# Patient Record
Sex: Female | Born: 1970 | Race: Black or African American | Hispanic: No | Marital: Married
Health system: Southern US, Community
[De-identification: ages and names within clinical notes are randomized; demographics above are authoritative.]

---

## 2003-06-14 ENCOUNTER — Ambulatory Visit (HOSPITAL_COMMUNITY): Admission: RE | Admit: 2003-06-14 | Discharge: 2003-06-14 | Payer: Self-pay | Admitting: *Deleted

## 2003-11-14 ENCOUNTER — Inpatient Hospital Stay (HOSPITAL_COMMUNITY): Admission: AD | Admit: 2003-11-14 | Discharge: 2003-11-17 | Payer: Self-pay | Admitting: *Deleted

## 2004-07-25 ENCOUNTER — Inpatient Hospital Stay (HOSPITAL_COMMUNITY): Admission: AD | Admit: 2004-07-25 | Discharge: 2004-07-25 | Payer: Self-pay | Admitting: Obstetrics and Gynecology

## 2004-08-09 ENCOUNTER — Emergency Department (HOSPITAL_COMMUNITY): Admission: EM | Admit: 2004-08-09 | Discharge: 2004-08-09 | Payer: Self-pay | Admitting: Emergency Medicine

## 2004-10-06 ENCOUNTER — Emergency Department (HOSPITAL_COMMUNITY): Admission: EM | Admit: 2004-10-06 | Discharge: 2004-10-06 | Payer: Self-pay | Admitting: Emergency Medicine

## 2006-12-26 ENCOUNTER — Emergency Department (HOSPITAL_COMMUNITY): Admission: EM | Admit: 2006-12-26 | Discharge: 2006-12-26 | Payer: Self-pay | Admitting: Emergency Medicine

## 2008-07-27 ENCOUNTER — Emergency Department (HOSPITAL_COMMUNITY): Admission: EM | Admit: 2008-07-27 | Discharge: 2008-07-28 | Payer: Self-pay | Admitting: Emergency Medicine
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
## 2009-08-18 ENCOUNTER — Emergency Department (HOSPITAL_COMMUNITY): Admission: EM | Admit: 2009-08-18 | Discharge: 2009-08-18 | Payer: Self-pay | Admitting: Emergency Medicine

## 2018-09-30 ENCOUNTER — Other Ambulatory Visit: Payer: Self-pay

## 2018-09-30 ENCOUNTER — Encounter (HOSPITAL_COMMUNITY): Payer: Self-pay | Admitting: Emergency Medicine

## 2018-09-30 ENCOUNTER — Ambulatory Visit (HOSPITAL_COMMUNITY)
Admission: EM | Admit: 2018-09-30 | Discharge: 2018-09-30 | Disposition: A | Discharge status: Home / Self Care | Payer: Self-pay | Attending: Family Medicine | Admitting: Family Medicine

## 2018-09-30 DIAGNOSIS — M7918 Myalgia, other site: Secondary | ICD-10-CM

## 2018-09-30 MED ORDER — MELOXICAM 7.5 MG PO TABS: 7.5000 mg | ORAL_TABLET | Freq: Every day | ORAL | 0 refills | Status: AC

## 2018-09-30 MED ORDER — CYCLOBENZAPRINE HCL 5 MG PO TABS: 5.0000 mg | ORAL_TABLET | Freq: Two times a day (BID) | ORAL | 0 refills | Status: AC | PRN

## 2018-09-30 MED ORDER — DICLOFENAC SODIUM 1 % TD GEL: 2.0000 g | Freq: Four times a day (QID) | TRANSDERMAL | 1 refills | Status: AC
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
## 2018-09-30 NOTE — Discharge Instructions (Signed)
Do not drink/drive while taking muscle relaxers. May use Voltaren topical gel for added pain relief.  Take meloxicam daily as well.  Return if you develop worsening vision, headache, pain, decreased range of motion.
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
